# Patient Record
Sex: Male | Born: 1960 | Race: White | Hispanic: No | Marital: Married | Smoking: Former smoker
Health system: Southern US, Community
[De-identification: ages and names within clinical notes are randomized; demographics above are authoritative.]

## PROBLEM LIST (undated history)

## (undated) DIAGNOSIS — I10 Essential (primary) hypertension: Secondary | ICD-10-CM

## (undated) HISTORY — PX: TONSILLECTOMY: SUR1361

## (undated) HISTORY — PX: WISDOM TOOTH EXTRACTION: SHX21

## (undated) HISTORY — PX: ELBOW SURGERY: SHX618

---

## 2018-08-28 ENCOUNTER — Other Ambulatory Visit: Payer: Self-pay

## 2018-08-28 ENCOUNTER — Emergency Department (INDEPENDENT_AMBULATORY_CARE_PROVIDER_SITE_OTHER)
Admission: EM | Admit: 2018-08-28 | Discharge: 2018-08-28 | Disposition: A | Discharge status: Home / Self Care | Payer: BLUE CROSS/BLUE SHIELD | Source: Home / Self Care | Attending: Family Medicine | Admitting: Family Medicine

## 2018-08-28 DIAGNOSIS — H8110 Benign paroxysmal vertigo, unspecified ear: Secondary | ICD-10-CM

## 2018-08-28 DIAGNOSIS — J111 Influenza due to unidentified influenza virus with other respiratory manifestations: Secondary | ICD-10-CM

## 2018-08-28 DIAGNOSIS — R69 Illness, unspecified: Secondary | ICD-10-CM

## 2018-08-28 HISTORY — DX: Essential (primary) hypertension: I10

## 2018-08-28 MED ORDER — MECLIZINE HCL 25 MG PO TABS: ORAL_TABLET | ORAL | 0 refills | Status: AC

## 2018-08-28 NOTE — ED Provider Notes (Signed)
[facility]    CSN: [id number] Arrival date & time: 08/28/18  1052     History   Chief Complaint Chief Complaint  Patient presents with  . Nasal Congestion  . Dizziness    HPI Cory White is a 58 y.o. male.   Patient developed flu-like symptoms six days ago with initial fatigue, myalgias, cough, nasal congestion, headache, fever, and dizziness.  His fever/sweats resolved 3 days ago but he continues to feel fatigued.  He has persistent mild disequilibrium with movement, which results in nausea and occasional vomiting.  His cough is improving and he denies shortness of breath or wheezing.  The history is provided by the patient.    Past Medical History:  Diagnosis Date  . Hypertension     There are no active problems to display for this patient.   Past Surgical History:  Procedure Laterality Date  . ELBOW SURGERY     right       Home Medications    Prior to Admission medications   Medication Sig Start Date End Date Taking? Authorizing Provider  lisinopril-hydrochlorothiazide (PRINZIDE,ZESTORETIC) 20-25 MG tablet TAKE 1 TABLET BY MOUTH EVERY DAY 06/10/18  Yes [provider]  rosuvastatin (CRESTOR) 10 MG tablet Take by mouth. 07/09/18  Yes [provider]  meclizine (ANTIVERT) 25 MG tablet Take one tab PO BID TID prn dizziness. 08/28/18   Lattie HawBeese, Ayoub Arey A, MD  sertraline (ZOLOFT) 50 MG tablet  07/30/18   [provider]    Family History History reviewed. No pertinent family history.  Social History Social History   Tobacco Use  . Smoking status: Not on file  Substance Use Topics  . Alcohol use: Not on file  . Drug use: Not on file     Allergies   Patient has no allergy information on record.   Review of Systems Review of Systems + sore throat + cough No pleuritic pain No wheezing + nasal congestion + post-nasal drainage No sinus pain/pressure No itchy/red eyes No earache + dizziness No hemoptysis No  SOB ? fever, + chills + nausea + vomiting, resolved No abdominal pain No diarrhea No urinary symptoms No skin rash + fatigue + myalgias + headache Used OTC meds without relief   Physical Exam Triage Vital Signs ED Triage Vitals  Enc Vitals Group     BP      Pulse      Resp      Temp      Temp src      SpO2      Weight      Height      Head Circumference      Peak Flow      Pain Score      Pain Loc      Pain Edu?      Excl. in GC?    No data found.  Updated Vital Signs BP 134/84 (BP Location: Right Arm)   Pulse 60   Temp 97.8 F (36.6 C) (Oral)   Resp 20   Ht 6\' 3"  (1.905 m)   Wt 95.3 kg   SpO2 97%   BMI 26.25 kg/m   Visual Acuity Right Eye Distance:   Left Eye Distance:   Bilateral Distance:    Right Eye Near:   Left Eye Near:    Bilateral Near:     Physical Exam Nursing notes and Vital Signs reviewed. Appearance:  Patient appears stated age, and in no acute distress Eyes:  Pupils  are equal, round, and reactive to light and accomodation.  Extraocular movement is intact.  Conjunctivae are not inflamed. Fundi benign.  Slight nystagmus noted. Ears:  Canals normal.  Tympanic membranes normal.  Nose:  Congested turbinates.  No sinus tenderness.   Pharynx:  Normal Neck:  Supple.  Enlarged posterior/lateral nodes are palpated bilaterally, tender to palpation on the left.   Lungs:  Clear to auscultation.  Breath sounds are equal.  Moving air well. Heart:  Regular rate and rhythm without murmurs, rubs, or gallops.  Abdomen:  Nontender without masses or hepatosplenomegaly.  Bowel sounds are present.  No CVA or flank tenderness.  Extremities:  No edema.  Skin:  No rash present.    UC Treatments / Results  Labs (all labs ordered are listed, but only abnormal results are displayed) Labs Reviewed - No data to display  EKG None  Radiology No results found.  Procedures Procedures (including critical care time)  Medications Ordered in UC Medications  - No data to display  Initial Impression / Assessment and Plan / UC Course  I have reviewed the triage vital signs and the nursing notes.  Pertinent labs & imaging results that were available during my care of the patient were reviewed by me and considered in my medical decision making (see chart for details).    There is no evidence of bacterial infection today.  Treat symptomatically for now  Begin Antivert. Followup with Family Doctor if not improved in one week.    Final Clinical Impressions(s) / UC Diagnoses   Final diagnoses:  Influenza-like illness  Benign paroxysmal positional vertigo, unspecified laterality     Discharge Instructions     Take plain guaifenesin (1200mg  extended release tabs such as Mucinex) twice daily, with plenty of water, for cough and congestion.  Get adequate rest.   May use Afrin nasal spray (or generic oxymetazoline) each morning for about 5 days and then discontinue.  Also recommend using saline nasal spray several times daily and saline nasal irrigation (AYR is a common brand).   Try warm salt water gargles for sore throat.  Stop all antihistamines for now, and other non-prescription cough/cold preparations. May take Delsym Cough Suppressant at bedtime for nighttime cough.     ED Prescriptions    Medication Sig Dispense Auth. Provider   meclizine (ANTIVERT) 25 MG tablet Take one tab PO BID TID prn dizziness. 15 tablet Lattie Haw, MD         Lattie Haw, MD 08/28/18 [phone]

## 2018-08-28 NOTE — Discharge Instructions (Addendum)
Take plain guaifenesin (1200mg extended release tabs such as Mucinex) twice daily, with plenty of water, for cough and congestion.  Get adequate rest.   °May use Afrin nasal spray (or generic oxymetazoline) each morning for about 5 days and then discontinue.  Also recommend using saline nasal spray several times daily and saline nasal irrigation (AYR is a common brand).  °Try warm salt water gargles for sore throat.  °Stop all antihistamines for now, and other non-prescription cough/cold preparations. °May take Delsym Cough Suppressant at bedtime for nighttime cough.  °

## 2018-08-28 NOTE — ED Triage Notes (Signed)
Pt feels like he had the Flu starting Friday night into Saturday morning.  Fever and cough, that way through Monday along with fatigue and poor appetite.  Fever broke yesterday morning and felt better, but gets dizzy and nausea when up moving around.  Has vomited after eating the last 3 meals.

## 2020-11-12 ENCOUNTER — Other Ambulatory Visit: Payer: Self-pay

## 2020-11-12 ENCOUNTER — Emergency Department (INDEPENDENT_AMBULATORY_CARE_PROVIDER_SITE_OTHER): Payer: BC Managed Care – PPO

## 2020-11-12 ENCOUNTER — Encounter: Payer: Self-pay | Admitting: Emergency Medicine

## 2020-11-12 ENCOUNTER — Emergency Department (INDEPENDENT_AMBULATORY_CARE_PROVIDER_SITE_OTHER)
Admission: EM | Admit: 2020-11-12 | Discharge: 2020-11-12 | Disposition: A | Discharge status: Home / Self Care | Payer: BC Managed Care – PPO | Source: Home / Self Care | Attending: Family Medicine | Admitting: Family Medicine

## 2020-11-12 DIAGNOSIS — M79672 Pain in left foot: Secondary | ICD-10-CM | POA: Diagnosis not present

## 2020-11-12 DIAGNOSIS — M722 Plantar fascial fibromatosis: Secondary | ICD-10-CM

## 2020-11-12 MED ORDER — PREDNISONE 20 MG PO TABS: ORAL_TABLET | ORAL | 0 refills | Status: AC

## 2020-11-12 NOTE — Discharge Instructions (Signed)
Apply ice pack for 20 to 30 minutes, 3 to 4 times daily  Continue until pain and swelling decrease.  Wear heal pad daytime.  Begin range of motion and stretching exercises.  Consider obtaining a night splint.

## 2020-11-12 NOTE — ED Triage Notes (Signed)
Pain to left heel when walking x 1 month  Pt denies injury   Small black area noted to outer part of heel  Pt states pain is on the inside Denies fever  No COVID vaccine  Denies having COVID

## 2020-11-12 NOTE — ED Provider Notes (Signed)
[facility]    CSN: [id number] Arrival date & time: 11/12/20  1412      History   Chief Complaint Chief Complaint  Patient presents with  . Foot Pain    left    HPI Cory White is a 60 y.o. male.   Patient complains of pain in the plantar surface of his left heel when standing and walking for about a month.  He recalls no injury, change in activities, or new shoes.   The history is provided by the patient.  Foot Pain This is a new problem. Episode onset: one month ago. The problem occurs constantly. The problem has not changed since onset.The symptoms are aggravated by walking and standing. Nothing relieves the symptoms. He has tried nothing for the symptoms.    Past Medical History:  Diagnosis Date  . Hypertension     There are no problems to display for this patient.   Past Surgical History:  Procedure Laterality Date  . ELBOW SURGERY     right  . TONSILLECTOMY    . WISDOM TOOTH EXTRACTION         Home Medications    Prior to Admission medications   Medication Sig Start Date End Date Taking? Authorizing Provider  lisinopril-hydrochlorothiazide (PRINZIDE,ZESTORETIC) 20-25 MG tablet TAKE 1 TABLET BY MOUTH EVERY DAY 06/10/18  Yes [provider]  predniSONE (DELTASONE) 20 MG tablet Take one tab by mouth twice daily for 4 days, then one daily for 3 days. Take with food. 11/12/20  Yes Lattie Haw, MD  rosuvastatin (CRESTOR) 10 MG tablet Take by mouth. 07/09/18  Yes [provider]  sertraline (ZOLOFT) 50 MG tablet  07/30/18  Yes [provider]  meclizine (ANTIVERT) 25 MG tablet Take one tab PO BID TID prn dizziness. Patient not taking: Reported on 11/12/2020 08/28/18   Lattie Haw, MD    Family History Family History  Problem Relation Age of Onset  . Atrial fibrillation Mother   . Heart attack Father   . Coronary artery disease Father     Social History Social History   Tobacco Use  . Smoking status: Former  Smoker    Types: Cigars    Quit date: 2015    Years since quitting: 7.3  . Smokeless tobacco: Never Used  Vaping Use  . Vaping Use: Never used     Allergies   Patient has no known allergies.   Review of Systems Review of Systems  Constitutional: Negative for chills, diaphoresis, fever and unexpected weight change.  Musculoskeletal:       Left heel pain  Skin: Negative for color change, rash and wound.  All other systems reviewed and are negative.    Physical Exam Triage Vital Signs ED Triage Vitals  Enc Vitals Group     BP 11/12/20 1434 (!) 145/78     Pulse Rate 11/12/20 1434 91     Resp 11/12/20 1434 16     Temp 11/12/20 1434 98.6 F (37 C)     Temp Source 11/12/20 1434 Oral     SpO2 11/12/20 1434 98 %     Weight 11/12/20 1435 220 lb (99.8 kg)     Height 11/12/20 1435 6\' 3"  (1.905 m)     Head Circumference --      Peak Flow --      Pain Score 11/12/20 1434 3     Pain Loc --      Pain Edu? --  Excl. in GC? --    No data found.  Updated Vital Signs BP (!) 145/78 (BP Location: Left Arm)   Pulse 91   Temp 98.6 F (37 C) (Oral)   Resp 16   Ht 6\' 3"  (1.905 m)   Wt 99.8 kg   SpO2 98%   BMI 27.50 kg/m   Visual Acuity Right Eye Distance:   Left Eye Distance:   Bilateral Distance:    Right Eye Near:   Left Eye Near:    Bilateral Near:     Physical Exam Vitals and nursing note reviewed.  Constitutional:      General: He is not in acute distress. HENT:     Head: Normocephalic.  Eyes:     Pupils: Pupils are equal, round, and reactive to light.  Cardiovascular:     Rate and Rhythm: Normal rate.  Pulmonary:     Effort: Pulmonary effort is normal.  Musculoskeletal:     Right lower leg: No edema.     Left lower leg: No edema.     Left foot: Normal range of motion. Tenderness present. No swelling, deformity, laceration or crepitus. Normal pulse.       Feet:     Comments: Left heel has distinct tenderness to palpation over the plantar surface as  noted on diagram.   Skin:    General: Skin is warm and dry.     Findings: No rash.  Neurological:     Mental Status: He is alert.      UC Treatments / Results  Labs (all labs ordered are listed, but only abnormal results are displayed) Labs Reviewed - No data to display  EKG   Radiology DG Os Calcis Left  Result Date: 11/12/2020 CLINICAL DATA:  Left heel pain.  Pain worse with walking. EXAM: LEFT OS CALCIS - 2+ VIEW COMPARISON:  None. FINDINGS: There is no evidence of fracture or other focal bone lesions. Soft tissues are unremarkable. IMPRESSION: Negative radiographs. Electronically Signed   By: 11/14/2020 M.D.   On: 11/12/2020 15:51    Procedures Procedures (including critical care time)  Medications Ordered in UC Medications - No data to display  Initial Impression / Assessment and Plan / UC Course  I have reviewed the triage vital signs and the nursing notes.  Pertinent labs & imaging results that were available during my care of the patient were reviewed by me and considered in my medical decision making (see chart for details).    Dispensed AirCast heel pad.  Begin prednisone burst/taper. Followup with Dr. 11/14/2020 ([facility] if not improving about two weeks.   Final Clinical Impressions(s) / UC Diagnoses   Final diagnoses:  Plantar fasciitis of left foot     Discharge Instructions     Apply ice pack for 20 to 30 minutes, 3 to 4 times daily  Continue until pain and swelling decrease.  Wear heal pad daytime.  Begin range of motion and stretching exercises.  Consider obtaining a night splint.    ED Prescriptions    Medication Sig Dispense Auth. Provider   predniSONE (DELTASONE) 20 MG tablet Take one tab by mouth twice daily for 4 days, then one daily for 3 days. Take with food. 11 tablet Rodney Langton, MD        Lattie Haw, MD 11/14/20 [phone]

## 2021-11-15 IMAGING — DX DG OS CALCIS 2+V*L*
2 series · 2 of 2 positions shown · non-contrast
Comparison: None.

CLINICAL DATA: Left heel pain.  Pain worse with walking.

EXAM:
LEFT OS CALCIS - 2+ VIEW

[calcaneus axial]
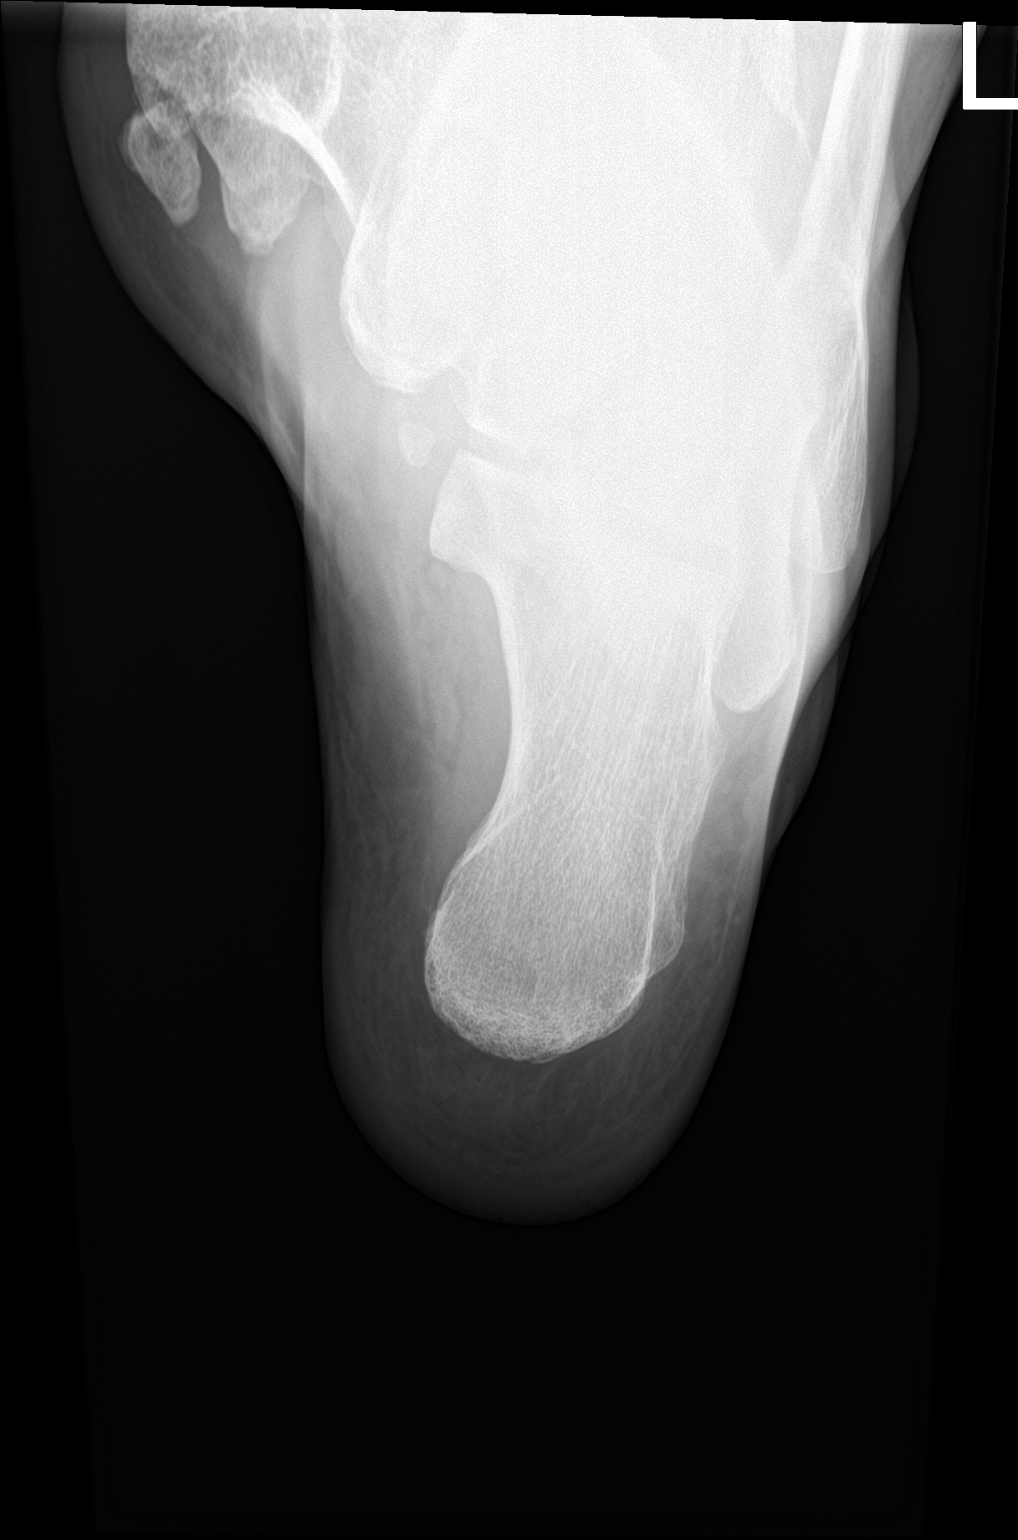

[calcaneus lat]
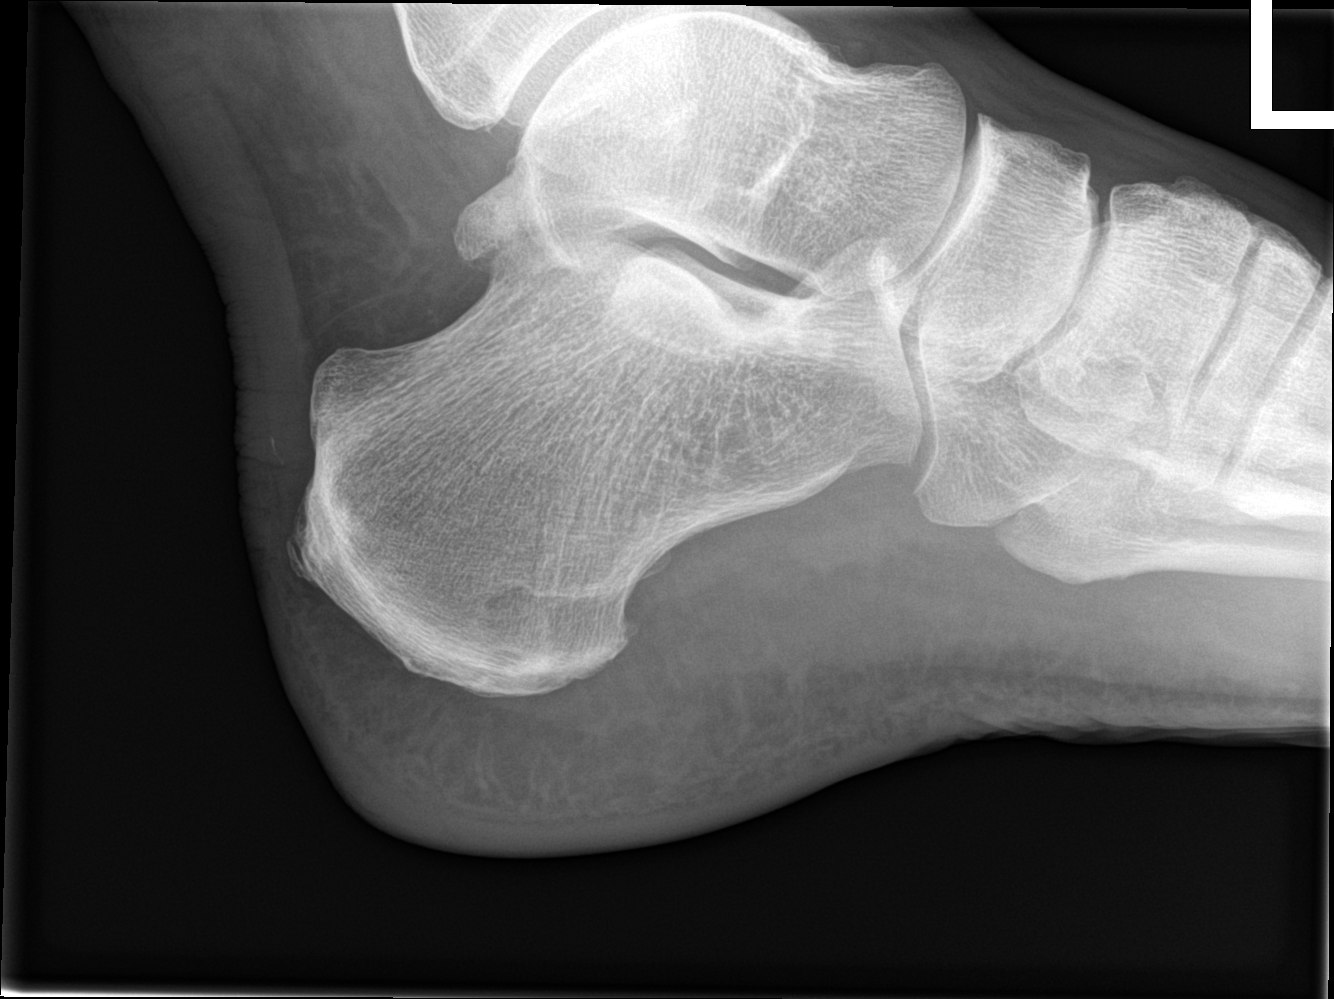

[2 of 2 positions shown; findings below may reference images not displayed]

FINDINGS: There is no evidence of fracture or other focal bone lesions. Soft
tissues are unremarkable.
IMPRESSION: Negative radiographs.
